# Patient Record
Sex: Female | Born: 1960 | Race: White | Hispanic: No | Smoking: Never smoker
Health system: Southern US, Community
[De-identification: ages and names within clinical notes are randomized; demographics above are authoritative.]

## PROBLEM LIST (undated history)

## (undated) DIAGNOSIS — G4700 Insomnia, unspecified: Secondary | ICD-10-CM

## (undated) DIAGNOSIS — J45909 Unspecified asthma, uncomplicated: Secondary | ICD-10-CM

## (undated) DIAGNOSIS — R638 Other symptoms and signs concerning food and fluid intake: Secondary | ICD-10-CM

## (undated) DIAGNOSIS — M5126 Other intervertebral disc displacement, lumbar region: Secondary | ICD-10-CM

## (undated) DIAGNOSIS — M79644 Pain in right finger(s): Secondary | ICD-10-CM

## (undated) DIAGNOSIS — K449 Diaphragmatic hernia without obstruction or gangrene: Secondary | ICD-10-CM

## (undated) DIAGNOSIS — Z780 Asymptomatic menopausal state: Secondary | ICD-10-CM

## (undated) DIAGNOSIS — E7800 Pure hypercholesterolemia, unspecified: Secondary | ICD-10-CM

## (undated) DIAGNOSIS — K589 Irritable bowel syndrome without diarrhea: Secondary | ICD-10-CM

## (undated) DIAGNOSIS — N3946 Mixed incontinence: Secondary | ICD-10-CM

## (undated) DIAGNOSIS — M1990 Unspecified osteoarthritis, unspecified site: Secondary | ICD-10-CM

## (undated) DIAGNOSIS — K219 Gastro-esophageal reflux disease without esophagitis: Secondary | ICD-10-CM

## (undated) DIAGNOSIS — M79645 Pain in left finger(s): Secondary | ICD-10-CM

## (undated) DIAGNOSIS — F419 Anxiety disorder, unspecified: Secondary | ICD-10-CM

## (undated) DIAGNOSIS — M5136 Other intervertebral disc degeneration, lumbar region: Secondary | ICD-10-CM

## (undated) DIAGNOSIS — F329 Major depressive disorder, single episode, unspecified: Secondary | ICD-10-CM

## (undated) DIAGNOSIS — IMO0002 Reserved for concepts with insufficient information to code with codable children: Secondary | ICD-10-CM

## (undated) DIAGNOSIS — Z87898 Personal history of other specified conditions: Secondary | ICD-10-CM

## (undated) DIAGNOSIS — F32A Depression, unspecified: Secondary | ICD-10-CM

## (undated) DIAGNOSIS — K635 Polyp of colon: Secondary | ICD-10-CM

## (undated) HISTORY — DX: Diaphragmatic hernia without obstruction or gangrene: K44.9

## (undated) HISTORY — DX: Unspecified osteoarthritis, unspecified site: M19.90

## (undated) HISTORY — PX: HYSTEROSCOPY: SHX211

## (undated) HISTORY — DX: Major depressive disorder, single episode, unspecified: F32.9

## (undated) HISTORY — PX: DIAGNOSTIC LAPAROSCOPY: SUR761

## (undated) HISTORY — DX: Anxiety disorder, unspecified: F41.9

## (undated) HISTORY — DX: Other intervertebral disc degeneration, lumbar region: M51.36

## (undated) HISTORY — DX: Depression, unspecified: F32.A

## (undated) HISTORY — PX: FOOT SURGERY: SHX648

## (undated) HISTORY — DX: Unspecified asthma, uncomplicated: J45.909

## (undated) HISTORY — PX: DILATION AND CURETTAGE OF UTERUS: SHX78

## (undated) HISTORY — DX: Asymptomatic menopausal state: Z78.0

## (undated) HISTORY — DX: Other symptoms and signs concerning food and fluid intake: R63.8

## (undated) HISTORY — DX: Pure hypercholesterolemia, unspecified: E78.00

## (undated) HISTORY — DX: Mixed incontinence: N39.46

## (undated) HISTORY — DX: Other intervertebral disc displacement, lumbar region: M51.26

## (undated) HISTORY — DX: Reserved for concepts with insufficient information to code with codable children: IMO0002

## (undated) HISTORY — DX: Polyp of colon: K63.5

## (undated) HISTORY — DX: Gastro-esophageal reflux disease without esophagitis: K21.9

---

## 2004-04-14 ENCOUNTER — Ambulatory Visit: Payer: Self-pay | Admitting: Anesthesiology

## 2004-10-12 ENCOUNTER — Ambulatory Visit: Payer: Self-pay | Admitting: Anesthesiology

## 2005-02-16 ENCOUNTER — Ambulatory Visit: Payer: Self-pay | Admitting: Anesthesiology

## 2005-03-29 ENCOUNTER — Ambulatory Visit: Payer: Self-pay | Admitting: Anesthesiology

## 2005-05-17 ENCOUNTER — Ambulatory Visit: Payer: Self-pay | Admitting: Obstetrics and Gynecology

## 2006-01-05 ENCOUNTER — Ambulatory Visit: Payer: Self-pay | Admitting: Anesthesiology

## 2006-03-06 ENCOUNTER — Ambulatory Visit: Payer: Self-pay | Admitting: Anesthesiology

## 2006-05-22 ENCOUNTER — Ambulatory Visit: Payer: Self-pay | Admitting: Obstetrics and Gynecology

## 2006-12-13 ENCOUNTER — Ambulatory Visit: Payer: Self-pay | Admitting: Anesthesiology

## 2007-12-17 ENCOUNTER — Ambulatory Visit: Payer: Self-pay | Admitting: Internal Medicine

## 2008-01-03 ENCOUNTER — Ambulatory Visit: Payer: Self-pay | Admitting: Anesthesiology

## 2008-06-06 ENCOUNTER — Ambulatory Visit: Payer: Self-pay | Admitting: Obstetrics and Gynecology

## 2012-11-20 ENCOUNTER — Ambulatory Visit: Payer: Self-pay | Admitting: Internal Medicine

## 2012-11-20 LAB — HM PAP SMEAR: HM PAP: NEGATIVE

## 2013-06-27 LAB — HM MAMMOGRAPHY

## 2014-06-23 ENCOUNTER — Ambulatory Visit: Payer: Self-pay | Admitting: Internal Medicine

## 2015-06-30 ENCOUNTER — Encounter: Payer: Self-pay | Admitting: Obstetrics and Gynecology

## 2015-10-12 ENCOUNTER — Other Ambulatory Visit: Payer: Self-pay | Admitting: Internal Medicine

## 2015-10-12 DIAGNOSIS — Z1231 Encounter for screening mammogram for malignant neoplasm of breast: Secondary | ICD-10-CM

## 2015-10-21 ENCOUNTER — Ambulatory Visit
Admission: RE | Admit: 2015-10-21 | Discharge: 2015-10-21 | Disposition: A | Discharge status: Home / Self Care | Payer: BC Managed Care – PPO | Source: Ambulatory Visit | Attending: Internal Medicine | Admitting: Internal Medicine

## 2015-10-21 DIAGNOSIS — Z1231 Encounter for screening mammogram for malignant neoplasm of breast: Secondary | ICD-10-CM | POA: Insufficient documentation

## 2015-11-27 NOTE — Discharge Instructions (Signed)
[facility] °[facility] ° °POST OPERATIVE INSTRUCTIONS FOR DR. TROXLER AND DR. FOWLER °[facility] ° ° °1. Take your medication as prescribed.  Pain medication should be taken only as needed. ° °2. Keep the dressing clean, dry and intact. ° °3. Keep your foot elevated above the heart level for the first 48 hours. ° °4. Walking to the bathroom and brief periods of walking are acceptable, unless we have instructed you to be non-weight bearing. ° °5. Always wear your post-op shoe when walking.  Always use your crutches if you are to be non-weight bearing. ° °6. Do not take a shower. Baths are permissible as long as the foot is kept out of the water.  ° °7. Every hour you are awake:  °- Bend your knee 15 times. °- Flex foot 15 times °- Massage calf 15 times ° °8. Call [facility] ([phone]) if any of the following problems occur: °- You develop a temperature or fever. °- The bandage becomes saturated with blood. °- Medication does not stop your pain. °- Injury of the foot occurs. °- Any symptoms of infection including redness, odor, or red streaks running from wound. ° °General Anesthesia, Adult, Care After °Refer to this sheet in the next few weeks. These instructions provide you with information on caring for yourself after your procedure. Your health care provider may also give you more specific instructions. Your treatment has been planned according to current medical practices, but problems sometimes occur. Call your health care provider if you have any problems or questions after your procedure. °WHAT TO EXPECT AFTER THE PROCEDURE °After the procedure, it is typical to experience: °· Sleepiness. °· Nausea and vomiting. °HOME CARE INSTRUCTIONS °· For the first 24 hours after general anesthesia: °¨ Have a responsible person with you. °¨ Do not drive a car. If you are alone, do not take public transportation. °¨ Do not drink alcohol. °¨ Do not take  medicine that has not been prescribed by your health care provider. °¨ Do not sign important papers or make important decisions. °¨ You may resume a normal diet and activities as directed by your health care provider. °· Change bandages (dressings) as directed. °· If you have questions or problems that seem related to general anesthesia, call the hospital and ask for the anesthetist or anesthesiologist on call. °SEEK MEDICAL CARE IF: °· You have nausea and vomiting that continue the day after anesthesia. °· You develop a rash. °SEEK IMMEDIATE MEDICAL CARE IF:  °· You have difficulty breathing. °· You have chest pain. °· You have any allergic problems. °  °This information is not intended to replace advice given to you by your health care provider. Make sure you discuss any questions you have with your health care provider. °  °Document Released: 09/19/2000 Document Revised: 07/04/2014 Document Reviewed: 10/12/2011 °Elsevier Interactive Patient Education ©2016 [employer] ° °

## 2015-12-03 ENCOUNTER — Ambulatory Visit: Payer: BC Managed Care – PPO

## 2015-12-03 ENCOUNTER — Ambulatory Visit: Payer: BC Managed Care – PPO | Admitting: Anesthesiology

## 2015-12-03 ENCOUNTER — Encounter
Admission: RE | Disposition: A | Discharge status: Home / Self Care | Payer: Self-pay | Source: Ambulatory Visit | Attending: Podiatry

## 2015-12-03 ENCOUNTER — Ambulatory Visit
Admission: RE | Admit: 2015-12-03 | Discharge: 2015-12-03 | Disposition: A | Discharge status: Home / Self Care | Payer: BC Managed Care – PPO | Source: Ambulatory Visit | Attending: Podiatry | Admitting: Podiatry

## 2015-12-03 DIAGNOSIS — G8929 Other chronic pain: Secondary | ICD-10-CM | POA: Insufficient documentation

## 2015-12-03 DIAGNOSIS — F418 Other specified anxiety disorders: Secondary | ICD-10-CM | POA: Insufficient documentation

## 2015-12-03 DIAGNOSIS — J45909 Unspecified asthma, uncomplicated: Secondary | ICD-10-CM | POA: Insufficient documentation

## 2015-12-03 DIAGNOSIS — Q660 Congenital talipes equinovarus: Secondary | ICD-10-CM | POA: Diagnosis not present

## 2015-12-03 DIAGNOSIS — M2041 Other hammer toe(s) (acquired), right foot: Secondary | ICD-10-CM | POA: Insufficient documentation

## 2015-12-03 DIAGNOSIS — M21171 Varus deformity, not elsewhere classified, right ankle: Secondary | ICD-10-CM | POA: Diagnosis not present

## 2015-12-03 DIAGNOSIS — K219 Gastro-esophageal reflux disease without esophagitis: Secondary | ICD-10-CM | POA: Insufficient documentation

## 2015-12-03 DIAGNOSIS — S90851A Superficial foreign body, right foot, initial encounter: Secondary | ICD-10-CM

## 2015-12-03 DIAGNOSIS — M1990 Unspecified osteoarthritis, unspecified site: Secondary | ICD-10-CM | POA: Diagnosis not present

## 2015-12-03 DIAGNOSIS — L089 Local infection of the skin and subcutaneous tissue, unspecified: Secondary | ICD-10-CM

## 2015-12-03 HISTORY — PX: GASTROC RECESSION EXTREMITY: SHX6262

## 2015-12-03 HISTORY — DX: Insomnia, unspecified: G47.00

## 2015-12-03 HISTORY — DX: Pain in right finger(s): M79.644

## 2015-12-03 HISTORY — DX: Irritable bowel syndrome without diarrhea: K58.9

## 2015-12-03 HISTORY — DX: Personal history of other specified conditions: Z87.898

## 2015-12-03 HISTORY — DX: Unspecified asthma, uncomplicated: J45.909

## 2015-12-03 HISTORY — DX: Pain in left finger(s): M79.645

## 2015-12-03 SURGERY — RECESSION, TENDON, GASTROCNEMIUS
Anesthesia: General | Site: Foot | Laterality: Right | Wound class: Clean

## 2015-12-03 MED ORDER — OXYCODONE-ACETAMINOPHEN 7.5-325 MG PO TABS: 1.0000 | ORAL_TABLET | ORAL | Status: DC | PRN

## 2015-12-03 MED ORDER — ROPIVACAINE HCL 5 MG/ML IJ SOLN
INTRAMUSCULAR | Status: DC | PRN
  Administered 2015-12-03: 50 mL via EPIDURAL

## 2015-12-03 MED ORDER — BUPIVACAINE HCL (PF) 0.5 % IJ SOLN
INTRAMUSCULAR | Status: DC | PRN
  Administered 2015-12-03: 7 mL

## 2015-12-03 MED ORDER — LACTATED RINGERS IV SOLN
INTRAVENOUS | Status: DC
  Administered 2015-12-03: 07:00:00 via INTRAVENOUS

## 2015-12-03 MED ORDER — OXYCODONE HCL 5 MG PO TABS: 5.0000 mg | ORAL_TABLET | Freq: Once | ORAL | Status: DC | PRN

## 2015-12-03 MED ORDER — MIDAZOLAM HCL 2 MG/2ML IJ SOLN
1.0000 mg | INTRAMUSCULAR | Status: DC | PRN
  Administered 2015-12-03: 2 mg via INTRAVENOUS

## 2015-12-03 MED ORDER — SCOPOLAMINE 1 MG/3DAYS TD PT72
MEDICATED_PATCH | TRANSDERMAL | Status: DC | PRN
  Administered 2015-12-03: 1 via TRANSDERMAL

## 2015-12-03 MED ORDER — LIDOCAINE-EPINEPHRINE 1 %-1:100000 IJ SOLN
INTRAMUSCULAR | Status: DC | PRN
  Administered 2015-12-03: 10 mL

## 2015-12-03 MED ORDER — PROPOFOL 500 MG/50ML IV EMUL
INTRAVENOUS | Status: DC | PRN
Start: 2015-12-03 — End: 2015-12-03
  Administered 2015-12-03: 75 ug/kg/min via INTRAVENOUS

## 2015-12-03 MED ORDER — ONDANSETRON HCL 4 MG/2ML IJ SOLN
INTRAMUSCULAR | Status: DC | PRN
  Administered 2015-12-03: 4 mg via INTRAVENOUS

## 2015-12-03 MED ORDER — DEXAMETHASONE SODIUM PHOSPHATE 4 MG/ML IJ SOLN
INTRAMUSCULAR | Status: DC | PRN
  Administered 2015-12-03: 8 mg via INTRAVENOUS

## 2015-12-03 MED ORDER — LIDOCAINE HCL (CARDIAC) 20 MG/ML IV SOLN
INTRAVENOUS | Status: DC | PRN
  Administered 2015-12-03: 30 mg via INTRAVENOUS
  Administered 2015-12-03: 20 mg via INTRAVENOUS

## 2015-12-03 MED ORDER — FENTANYL CITRATE (PF) 100 MCG/2ML IJ SOLN: 25.0000 ug | INTRAMUSCULAR | Status: DC | PRN

## 2015-12-03 MED ORDER — OXYCODONE HCL 5 MG/5ML PO SOLN: 5.0000 mg | Freq: Once | ORAL | Status: DC | PRN

## 2015-12-03 MED ORDER — FENTANYL CITRATE (PF) 100 MCG/2ML IJ SOLN
50.0000 ug | INTRAMUSCULAR | Status: DC | PRN
  Administered 2015-12-03: 50 ug via INTRAVENOUS

## 2015-12-03 MED ORDER — SODIUM CHLORIDE 0.9 % IV SOLN
600.0000 mg | Freq: Once | INTRAVENOUS | Status: AC
  Administered 2015-12-03: 600 mg via INTRAVENOUS

## 2015-12-03 MED ORDER — PROPOFOL 10 MG/ML IV BOLUS
INTRAVENOUS | Status: DC | PRN
  Administered 2015-12-03: 100 mg via INTRAVENOUS

## 2015-12-03 SURGICAL SUPPLY — 78 items
BAG SNAP BAND KOVER 36X36 (MISCELLANEOUS) ×12 IMPLANT
BANDAGE ELASTIC 4 CLIP NS LF (GAUZE/BANDAGES/DRESSINGS) ×4 IMPLANT
BENZOIN TINCTURE PRP APPL 2/3 (GAUZE/BANDAGES/DRESSINGS) ×8 IMPLANT
BIT DRILL CANN 3.0 (BIT) ×4 IMPLANT
BLADE CRESCENTIC (BLADE) IMPLANT
BLADE MED AGGRESSIVE (BLADE) ×4 IMPLANT
BLADE MINI RND TIP GREEN BEAV (BLADE) ×4 IMPLANT
BLADE OSC/SAGITTAL 5.5X25 (BLADE) IMPLANT
BLADE OSC/SAGITTAL MD 5.5X18 (BLADE) IMPLANT
BLADE OSC/SAGITTAL MD 9X18.5 (BLADE) IMPLANT
BLADE OSCILLATING/SAGITTAL (BLADE) ×2
BLADE SW THK.38XMED LNG THN (BLADE) ×2 IMPLANT
BNDG ESMARK 4X12 TAN STRL LF (GAUZE/BANDAGES/DRESSINGS) ×4 IMPLANT
BNDG GAUZE 4.5X4.1 6PLY STRL (MISCELLANEOUS) ×4 IMPLANT
BNDG STRETCH 4X75 STRL LF (GAUZE/BANDAGES/DRESSINGS) ×4 IMPLANT
BUR EGG 4X8 MED (BURR) IMPLANT
BUR STRYKR EGG 5.0 (BURR) IMPLANT
CANISTER SUCT 1200ML W/VALVE (MISCELLANEOUS) ×4 IMPLANT
CAST PADDING 3X4FT ST 30246 (SOFTGOODS) ×2
CLOSURE WOUND 1/4X4 (GAUZE/BANDAGES/DRESSINGS) ×1
COVER LIGHT HANDLE UNIVERSAL (MISCELLANEOUS) ×12 IMPLANT
COVER PIN YLW 0.028-062 (MISCELLANEOUS) ×4 IMPLANT
CUFF TOURN SGL QUICK 18 (TOURNIQUET CUFF) IMPLANT
CUFF TOURN SGL QUICK 34 (TOURNIQUET CUFF) ×2
CUFF TRNQT CYL 34X4X40X1 (TOURNIQUET CUFF) ×2 IMPLANT
DRAPE FLUOR MINI C-ARM 54X84 (DRAPES) ×4 IMPLANT
DRAPE SHEET LG 3/4 BI-LAMINATE (DRAPES) ×12 IMPLANT
DRILL WIRE PASS (DRILL) IMPLANT
DRSG TELFA 3X8 NADH (GAUZE/BANDAGES/DRESSINGS) ×4 IMPLANT
DURAPREP 26ML APPLICATOR (WOUND CARE) ×8 IMPLANT
GAUZE PETRO XEROFOAM 1X8 (MISCELLANEOUS) ×4 IMPLANT
GAUZE SPONGE 4X4 12PLY STRL (GAUZE/BANDAGES/DRESSINGS) ×4 IMPLANT
GLOVE BIO SURGEON STRL SZ8 (GLOVE) ×4 IMPLANT
GOWN STRL REUS W/ TWL LRG LVL3 (GOWN DISPOSABLE) ×2 IMPLANT
GOWN STRL REUS W/ TWL XL LVL3 (GOWN DISPOSABLE) ×4 IMPLANT
GOWN STRL REUS W/TWL LRG LVL3 (GOWN DISPOSABLE) ×2
GOWN STRL REUS W/TWL XL LVL3 (GOWN DISPOSABLE) ×4
HEMOSTAT SURGICEL 2X3 (HEMOSTASIS) ×4 IMPLANT
K-WIRE 1.1 (WIRE) ×6
K-WIRE DBL END TROCAR 6X.045 (WIRE)
K-WIRE DBL END TROCAR 6X.062 (WIRE)
K-WIRE DBL TROCAR .045X4 ×4 IMPLANT
K-WIRE FX100X1.1XTROC TIP (WIRE) ×6
KIT ROOM TURNOVER OR (KITS) ×4 IMPLANT
KWIRE DBL END TROCAR 6X.045 (WIRE) IMPLANT
KWIRE DBL END TROCAR 6X.062 (WIRE) IMPLANT
KWIRE DBL TROCAR .045X4 ×2 IMPLANT
KWIRE FX100X1.1XTROC TIP (WIRE) ×6 IMPLANT
NEEDLE HYPO 18GX1.5 BLUNT FILL (NEEDLE) IMPLANT
NEEDLE HYPO 25GX1X1/2 BEV (NEEDLE) IMPLANT
NS IRRIG 500ML POUR BTL (IV SOLUTION) ×4 IMPLANT
PACK EXTREMITY ARMC (MISCELLANEOUS) ×4 IMPLANT
PAD CAST CTTN 3X4 STRL (SOFTGOODS) ×2 IMPLANT
PAD GROUND ADULT SPLIT (MISCELLANEOUS) ×8 IMPLANT
RASP SM TEAR CROSS CUT (RASP) IMPLANT
SCREW CANN 2.2X12 (Screw) ×8 IMPLANT
SCREW COMP SH THR 2.2X11 (Screw) IMPLANT
SCREW COMP ST 14MM (Screw) ×4 IMPLANT
SPLINT CAST 1 STEP 4X30 (MISCELLANEOUS) ×4 IMPLANT
SPLINT FAST PLASTER 5X30 (CAST SUPPLIES)
SPLINT PLASTER CAST FAST 5X30 (CAST SUPPLIES) IMPLANT
STOCKINETTE STRL 6IN 960660 (GAUZE/BANDAGES/DRESSINGS) ×8 IMPLANT
STRAP BODY AND KNEE 60X3 (MISCELLANEOUS) ×8 IMPLANT
STRIP CLOSURE SKIN 1/4X4 (GAUZE/BANDAGES/DRESSINGS) ×3 IMPLANT
SUT ETHILON 4-0 (SUTURE)
SUT ETHILON 4-0 FS2 18XMFL BLK (SUTURE)
SUT ETHILON 5-0 FS-2 18 BLK (SUTURE) ×8 IMPLANT
SUT VIC AB 1 CT1 36 (SUTURE) IMPLANT
SUT VIC AB 2-0 CT1 27 (SUTURE)
SUT VIC AB 2-0 CT1 TAPERPNT 27 (SUTURE) IMPLANT
SUT VIC AB 2-0 SH 27 (SUTURE)
SUT VIC AB 2-0 SH 27XBRD (SUTURE) IMPLANT
SUT VIC AB 3-0 SH 27 (SUTURE)
SUT VIC AB 3-0 SH 27X BRD (SUTURE) IMPLANT
SUT VIC AB 4-0 FS2 27 (SUTURE) ×4 IMPLANT
SUT VICRYL AB 3-0 FS1 BRD 27IN (SUTURE) IMPLANT
SUTURE ETHLN 4-0 FS2 18XMF BLK (SUTURE) IMPLANT
SYRINGE 10CC LL (SYRINGE) IMPLANT

## 2015-12-03 NOTE — Anesthesia Preprocedure Evaluation (Signed)
Anesthesia Evaluation  Patient identified by MRN, date of birth, ID band Patient awake    Airway Mallampati: III  TM Distance: >3 FB Neck ROM: Full    Dental   Pulmonary asthma (well controlled) ,    breath sounds clear to auscultation (-) wheezing      Cardiovascular Normal cardiovascular exam     Neuro/Psych Anxiety Depression    GI/Hepatic GERD  Medicated and Controlled,  Endo/Other    Renal/GU      Musculoskeletal  (+) Arthritis , Osteoarthritis,    Abdominal   Peds  Hematology   Anesthesia Other Findings   Reproductive/Obstetrics                             Anesthesia Physical Anesthesia Plan  ASA: II  Anesthesia Plan:    Post-op Pain Management: GA combined w/ Regional for post-op pain   Induction: Intravenous  Airway Management Planned: LMA  Additional Equipment:   Intra-op Plan:   Post-operative Plan:   Informed Consent: I have reviewed the patients History and Physical, chart, labs and discussed the procedure including the risks, benefits and alternatives for the proposed anesthesia with the patient or authorized representative who has indicated his/her understanding and acceptance.     Plan Discussed with: CRNA  Anesthesia Plan Comments:         Anesthesia Quick Evaluation

## 2015-12-03 NOTE — Op Note (Signed)
Operative note   Surgeon: Dr. Recardo EvangelistMatthew Zakiya Novak, DPM.    Assistant: None    Preop diagnosis: #1 gastroc equinus right. #2 plantar displaced second metatarsal right. #3 plantar displaced third metatarsal right. #4 hammertoe deformity second toe right    Postop diagnosis: Same    Procedure:   1. Gastroc recession right Achilles   2. Osteotomy second metatarsal with 2 screw fixation   3. Osteotomy third metatarsal with 2 screw fixation    4. Hammertoe repair with arthrodesis of the PIP joint and K wire fixation     EBL: 20 cc    Anesthesia:general with popliteal block delivered by anesthesia team    Hemostasis: Thigh tourniquet 325 mmHg pressure initially up for 19 minutes then released for 20 minutes then re-elevated again for 51 minutes.    Specimen: None    Complications: None     Operative indications. Chronic pain and structural deformity unresponsive to conservative care    Procedure:  Patient was brought into the OR and placed on the operating table in theprone position initially after the gastroc recession the patient was able to help turnover and was placed in the supine position for the remainder of the case.. After anesthesia was obtained theright lower extremity was prepped and draped in usual sterile fashion.  Operative Report: At this time attention was directed to the right gastrocnemius tendon area just below the muscular good tendinous junction to the gastroc. A 3 semilunar incision was made just medial to the midline of the calf. The incision was deepened sharp blunt dissection bleeders clamped and bovied as required. Blunt dissection was used and vital structures retracted mediolaterally. The tendon sheath was noted this time and incised longitudinally peritenon was incised and retracted also mediolaterally. Dorsiflexion was placed to the foot tightening the gastrocnemius tendon and the gastroc tendon was released from medial to lateral. Excellent range of motion was  noted at this was Was. Following copious irrigation the tendon sheath was closed as well as the peritenon with 4 Vicryl continuous stitch. Deep superficial fascial layers closed with 4 Vicryl in continuous stitch and skin closed with 4 Vicryl subcuticular fashion.  This time to his directed the second metatarsal phalangeal joint and toe where a curvilinear incision made over the MTPJ and extended into 2 semielliptical incisions over the second toe. The slip skin was then removed the entire incision was then deepened sharp blunt dissection. The extensor tendon over the MTP joint distal metatarsal head was identified and released medially and retracted laterally and incision made through the capsular periosteal tissue and deepened with sharp dissection fraying the medial lateral capsular tissue away from the metatarsal head. An osteotomy was then performed in a dorsal distal plantar proximal fashion obliquely starting at the osteochondral junction of second metatarsal head dorsally. Once this was through and through the metatarsal head was shortened the osteotomy was feathered so that there would be no plantar flexion with the shortening process. The area was then temporally fixated checked FluoroScan good position and correction were noted. This point one of the K wires broke off during the drilling process this was removed checked with FluoroScan and no residual pin was noted. As per protocol heart x-ray was taken and sent to radiology for an over read they also didn't see any pins remaining in the foot. Once this was assured and was not in their through FluoroScan and the procedure was continued. 2 screws were placed across the osteotomy air checked FluoroScan good position and correction  were noted. This time to his directed to the PIP joint of the second toe where the extensor tendon was identified and incised transversely reflected proximally. The articular cartilages moved from the proximal phalanx head and  middle phalanx base and 0.5 K wires drilled through the middle distal phalanx and retrograded into the proximal phalanx holding the middle phalanx base proximal phalanx head together care was taken to remove any soft tissue impingement. There is checked with visual and FluoroScan and good alignment to the PIP joint was noted the pin was in carried through the metatarsal phalangeal joint across the metatarsal head while holding the toe in a plantar and lateral position to promote correction of the position of the joint. There is checked FluoroScan good position and correction were noted. There is an copiously irrigated and capsule tissue was closed with 4 Vicryl in a continuous stitch as were deep superficial fascial layers. Skin overlying the metatarsal phalangeal joint was closed with 4 Vicryl subcuticular stitch. Extensor tendon was reapproximated with 4 Vicryl simple interrupted sutures. Skin over the toe was closed with 5-0 nylon horizontal mattress sutures.  This time to his directed to the third metatarsal of the right foot where similar procedures performed as that on the second metatarsal. No hammertoe was needed.  Sterile compressive dressings placed across the area following a Marcaine block at the base of the incision margins on the dorsum of the foot. Dressings included Steri-Strips Xeroform gauze 4 x 4's Kling Kerlix tourniquet was released proximal complete vascularity seen to return all digits of the right foot. A posterior splint is placed on the right foot leg in the operating room.    Patient tolerated the procedure and anesthesia well.  Was transported from the OR to the PACU with all vital signs stable and vascular status intact. To be discharged per routine protocol.  Will follow up in approximately 1 week in the outpatient clinic.

## 2015-12-03 NOTE — Progress Notes (Signed)
Assisted Scott Mculloch ANMD with right, ultrasound guided, popliteal/saphenous block. Side rails up, monitors on throughout procedure. See vital signs in flow sheet. Tolerated Procedure well.

## 2015-12-03 NOTE — Transfer of Care (Signed)
Immediate Anesthesia Transfer of Care Note  Patient: Ashley Novak  Procedure(s) Performed: Procedure(s) with comments: WEIL OSTEOTOMY 2ND 3RD METATARSAL RIGHT (Right) - LMA WITH POPLITEAL K WIRE HAMMER TOE CORRECTION (Right) GASTROC RECESSION EXTREMITY (Right)  Patient Location: PACU  Anesthesia Type: No value filed.  Level of Consciousness: awake, alert  and patient cooperative  Airway and Oxygen Therapy: Patient Spontanous Breathing and Patient connected to supplemental oxygen  Post-op Assessment: Post-op Vital signs reviewed, Patient's Cardiovascular Status Stable, Respiratory Function Stable, Patent Airway and No signs of Nausea or vomiting  Post-op Vital Signs: Reviewed and stable  Complications: No apparent anesthesia complications

## 2015-12-03 NOTE — Anesthesia Postprocedure Evaluation (Signed)
Anesthesia Post Note  Patient: Ashley Novak  Procedure(s) Performed: Procedure(s) (LRB): WEIL OSTEOTOMY 2ND 3RD METATARSAL RIGHT (Right) HAMMER TOE CORRECTION (Right) GASTROC RECESSION EXTREMITY (Right)  Patient location during evaluation: PACU Anesthesia Type: General Level of consciousness: awake and alert Pain management: pain level controlled Vital Signs Assessment: post-procedure vital signs reviewed and stable Respiratory status: spontaneous breathing, nonlabored ventilation, respiratory function stable and patient connected to nasal cannula oxygen Cardiovascular status: blood pressure returned to baseline and stable Postop Assessment: no signs of nausea or vomiting Anesthetic complications: no    Ashley Novak, Ashley Novak

## 2015-12-03 NOTE — H&P (Signed)
H and P has been reviewed and no changes are noted.  

## 2015-12-03 NOTE — Anesthesia Procedure Notes (Addendum)
Anesthesia Regional Block:  Adductor canal block  Pre-Anesthetic Checklist: ,, timeout performed, Correct Patient, Correct Site, Correct Laterality, Correct Procedure, Correct Position, site marked, Risks and benefits discussed,  Surgical consent,  Pre-op evaluation,  At surgeon's request and post-op pain management   Prep: chloraprep       Needles:  Injection technique: Single-shot  Needle Type: Echogenic Needle     Needle Length: 9cm 9 cm Needle Gauge: 21 and 21 G    Additional Needles:  Procedures: ultrasound guided (picture in chart) Adductor canal block Narrative:  Start time: 12/03/2015 7:01 AM End time: 12/03/2015 7:13 AM Injection made incrementally with aspirations every 5 mL.  Performed by: Personally  Anesthesiologist: MCCULLOCH, SCOTT  Additional Notes: Functioning IV was confirmed and monitors applied. Ultrasound guidance: relevant anatomy identified, needle position confirmed, local anesthetic spread visualized around nerve(s)., vascular puncture avoided.  Image printed for medical record.  Negative aspiration and no paresthesias; incremental administration of local anesthetic. The patient tolerated the procedure well. Vitals signes recorded in RN notes.   Anesthesia Regional Block:  Popliteal block  Pre-Anesthetic Checklist: ,, timeout performed, Correct Patient, Correct Site, Correct Laterality, Correct Procedure, Correct Position, site marked, Risks and benefits discussed,  Surgical consent,  Pre-op evaluation,  At surgeon's request and post-op pain management  Laterality: Right  Prep: chloraprep       Needles:  Injection technique: Single-shot  Needle Type: Echogenic Needle     Needle Length: 9cm 9 cm Needle Gauge: 21 and 21 G    Additional Needles:  Procedures: ultrasound guided (picture in chart) Popliteal block Narrative:  Start time: 12/03/2015 7:01 AM End time: 12/03/2015 7:13 AM Injection made incrementally with aspirations every 5  mL.  Performed by: Personally  Anesthesiologist: MCCULLOCH, SCOTT  Additional Notes: Functioning IV was confirmed and monitors applied. Ultrasound guidance: relevant anatomy identified, needle position confirmed, local anesthetic spread visualized around nerve(s)., vascular puncture avoided.  Image printed for medical record.  Negative aspiration and no paresthesias; incremental administration of local anesthetic. The patient tolerated the procedure well. Vitals signes recorded in RN notes.   Procedure Name: MAC Date/Time: 12/03/2015 7:40 AM Performed by: Maree KrabbeWARREN, Yadiel Aubry Pre-anesthesia Checklist: Patient identified, Emergency Drugs available, Suction available, Patient being monitored and Timeout performed Patient Re-evaluated:Patient Re-evaluated prior to inductionOxygen Delivery Method: Simple face mask Placement Confirmation: positive ETCO2 and breath sounds checked- equal and bilateral    Procedure Name: LMA Insertion Date/Time: 12/03/2015 8:25 AM Performed by: Maree KrabbeWARREN, Pio Eatherly Pre-anesthesia Checklist: Patient identified, Emergency Drugs available, Suction available, Timeout performed and Patient being monitored Patient Re-evaluated:Patient Re-evaluated prior to inductionOxygen Delivery Method: Circle system utilized Preoxygenation: Pre-oxygenation with 100% oxygen Intubation Type: IV induction LMA: LMA inserted LMA Size: 4.0 Number of attempts: 1 Placement Confirmation: positive ETCO2 and breath sounds checked- equal and bilateral Tube secured with: Tape

## 2015-12-03 NOTE — OR Nursing (Signed)
X-RAY TAKEN BY RADIOLOGY DEPARTMENT PER ORDER OF DR. TROXLER.PER DR. TROXLER IT TOOK 45 MINUTES FOR RADIOLOGIST TO CALL WITH HIS REPORT

## 2015-12-04 ENCOUNTER — Encounter: Payer: Self-pay | Admitting: Podiatry

## 2016-06-10 NOTE — Discharge Instructions (Signed)
[facility] °[facility] ° °POST OPERATIVE INSTRUCTIONS FOR DR. TROXLER AND DR. FOWLER °[facility] ° ° °1. Take your medication as prescribed.  Pain medication should be taken only as needed. ° °2. Keep the dressing clean, dry and intact. ° °3. Keep your foot elevated above the heart level for the first 48 hours. ° °4. Walking to the bathroom and brief periods of walking are acceptable, unless we have instructed you to be non-weight bearing. ° °5. Always wear your post-op shoe when walking.  Always use your crutches if you are to be non-weight bearing. ° °6. Do not take a shower. Baths are permissible as long as the foot is kept out of the water.  ° °7. Every hour you are awake:  °- Bend your knee 15 times. °- Flex foot 15 times °- Massage calf 15 times ° °8. Call [facility] ([phone]) if any of the following problems occur: °- You develop a temperature or fever. °- The bandage becomes saturated with blood. °- Medication does not stop your pain. °- Injury of the foot occurs. °- Any symptoms of infection including redness, odor, or red streaks running from wound. ° ° °General Anesthesia, Adult, Care After °These instructions provide you with information about caring for yourself after your procedure. Your health care provider may also give you more specific instructions. Your treatment has been planned according to current medical practices, but problems sometimes occur. Call your health care provider if you have any problems or questions after your procedure. °What can I expect after the procedure? °After the procedure, it is common to have: °· Vomiting. °· A sore throat. °· Mental slowness. °It is common to feel: °· Nauseous. °· Cold or shivery. °· Sleepy. °· Tired. °· Sore or achy, even in parts of your body where you did not have surgery. °Follow these instructions at home: °For at least 24 hours after the procedure: °· Do not: °¨ Participate in  activities where you could fall or become injured. °¨ Drive. °¨ Use heavy machinery. °¨ Drink alcohol. °¨ Take sleeping pills or medicines that cause drowsiness. °¨ Make important decisions or sign legal documents. °¨ Take care of children on your own. °· Rest. °Eating and drinking °· If you vomit, drink water, juice, or soup when you can drink without vomiting. °· Drink enough fluid to keep your urine clear or pale yellow. °· Make sure you have little or no nausea before eating solid foods. °· Follow the diet recommended by your health care provider. °General instructions °· Have a responsible adult stay with you until you are awake and alert. °· Return to your normal activities as told by your health care provider. Ask your health care provider what activities are safe for you. °· Take over-the-counter and prescription medicines only as told by your health care provider. °· If you smoke, do not smoke without supervision. °· Keep all follow-up visits as told by your health care provider. This is important. °Contact a health care provider if: °· You continue to have nausea or vomiting at home, and medicines are not helpful. °· You cannot drink fluids or start eating again. °· You cannot urinate after 8-12 hours. °· You develop a skin rash. °· You have fever. °· You have increasing redness at the site of your procedure. °Get help right away if: °· You have difficulty breathing. °· You have chest pain. °· You have unexpected bleeding. °· You feel that you are having   a life-threatening or urgent problem. °This information is not intended to replace advice given to you by your health care provider. Make sure you discuss any questions you have with your health care provider. °Document Released: 09/19/2000 Document Revised: 11/16/2015 Document Reviewed: 05/28/2015 °Elsevier Interactive Patient Education © 2017 [employer] Inc. ° °

## 2016-06-13 ENCOUNTER — Encounter: Payer: Self-pay | Admitting: *Deleted

## 2016-06-16 ENCOUNTER — Encounter
Admission: RE | Disposition: A | Discharge status: Home / Self Care | Payer: Self-pay | Source: Ambulatory Visit | Attending: Podiatry

## 2016-06-16 ENCOUNTER — Ambulatory Visit: Payer: BC Managed Care – PPO | Admitting: Anesthesiology

## 2016-06-16 ENCOUNTER — Ambulatory Visit
Admission: RE | Admit: 2016-06-16 | Discharge: 2016-06-16 | Disposition: A | Discharge status: Home / Self Care | Payer: BC Managed Care – PPO | Source: Ambulatory Visit | Attending: Podiatry | Admitting: Podiatry

## 2016-06-16 DIAGNOSIS — G8929 Other chronic pain: Secondary | ICD-10-CM | POA: Insufficient documentation

## 2016-06-16 DIAGNOSIS — E7800 Pure hypercholesterolemia, unspecified: Secondary | ICD-10-CM | POA: Diagnosis not present

## 2016-06-16 DIAGNOSIS — M216X2 Other acquired deformities of left foot: Secondary | ICD-10-CM | POA: Diagnosis not present

## 2016-06-16 DIAGNOSIS — J45909 Unspecified asthma, uncomplicated: Secondary | ICD-10-CM | POA: Insufficient documentation

## 2016-06-16 DIAGNOSIS — M21962 Unspecified acquired deformity of left lower leg: Secondary | ICD-10-CM

## 2016-06-16 HISTORY — PX: GASTROC RECESSION EXTREMITY: SHX6262

## 2016-06-16 HISTORY — PX: WEIL OSTEOTOMY: SHX5044

## 2016-06-16 SURGERY — RECESSION, TENDON, GASTROCNEMIUS
Anesthesia: Regional | Site: Leg Lower | Laterality: Left | Wound class: Clean

## 2016-06-16 MED ORDER — PROPOFOL 10 MG/ML IV BOLUS
INTRAVENOUS | Status: DC | PRN
  Administered 2016-06-16: 200 mg via INTRAVENOUS

## 2016-06-16 MED ORDER — BUPIVACAINE HCL (PF) 0.5 % IJ SOLN
INTRAMUSCULAR | Status: DC | PRN
  Administered 2016-06-16: 6 mL

## 2016-06-16 MED ORDER — GLYCOPYRROLATE 0.2 MG/ML IJ SOLN
INTRAMUSCULAR | Status: DC | PRN
  Administered 2016-06-16: 0.2 mg via INTRAVENOUS

## 2016-06-16 MED ORDER — LIDOCAINE HCL (CARDIAC) 20 MG/ML IV SOLN
INTRAVENOUS | Status: DC | PRN
Start: 2016-06-16 — End: 2016-06-16
  Administered 2016-06-16: 30 mg via INTRATRACHEAL

## 2016-06-16 MED ORDER — ACETAMINOPHEN 10 MG/ML IV SOLN
INTRAVENOUS | Status: DC | PRN
  Administered 2016-06-16: 1000 mg via INTRAVENOUS

## 2016-06-16 MED ORDER — OXYCODONE HCL 5 MG/5ML PO SOLN: 5.0000 mg | Freq: Once | ORAL | Status: DC | PRN

## 2016-06-16 MED ORDER — FENTANYL CITRATE (PF) 100 MCG/2ML IJ SOLN
50.0000 ug | INTRAMUSCULAR | Status: AC | PRN
  Administered 2016-06-16 (×4): 25 ug via INTRAVENOUS

## 2016-06-16 MED ORDER — LACTATED RINGERS IV SOLN
INTRAVENOUS | Status: DC
  Administered 2016-06-16 (×2): via INTRAVENOUS

## 2016-06-16 MED ORDER — CLINDAMYCIN PHOSPHATE 600 MG/4ML IJ SOLN
600.0000 mg | Freq: Once | INTRAMUSCULAR | Status: AC
  Administered 2016-06-16: 600 mg via INTRAVENOUS

## 2016-06-16 MED ORDER — LIDOCAINE-EPINEPHRINE 2 %-1:100000 IJ SOLN
INTRAMUSCULAR | Status: DC | PRN
  Administered 2016-06-16: 10 mL

## 2016-06-16 MED ORDER — OXYCODONE HCL 5 MG PO TABS: 5.0000 mg | ORAL_TABLET | Freq: Once | ORAL | Status: DC | PRN

## 2016-06-16 MED ORDER — LACTATED RINGERS IV SOLN: INTRAVENOUS | Status: DC

## 2016-06-16 MED ORDER — DEXAMETHASONE SODIUM PHOSPHATE 4 MG/ML IJ SOLN
INTRAMUSCULAR | Status: DC | PRN
  Administered 2016-06-16: 4 mg via INTRAVENOUS

## 2016-06-16 MED ORDER — OXYCODONE-ACETAMINOPHEN 7.5-325 MG PO TABS: 1.0000 | ORAL_TABLET | ORAL | 0 refills | Status: AC | PRN

## 2016-06-16 MED ORDER — ONDANSETRON HCL 4 MG/2ML IJ SOLN
INTRAMUSCULAR | Status: DC | PRN
Start: 2016-06-16 — End: 2016-06-16
  Administered 2016-06-16: 4 mg via INTRAVENOUS

## 2016-06-16 SURGICAL SUPPLY — 80 items
BANDAGE ELASTIC 4 LF NS (GAUZE/BANDAGES/DRESSINGS) ×5 IMPLANT
BENZOIN TINCTURE PRP APPL 2/3 (GAUZE/BANDAGES/DRESSINGS) ×10 IMPLANT
BIT DRILL 1.7 LNG CANN (DRILL) ×4 IMPLANT
BLADE CRESCENTIC (BLADE) IMPLANT
BLADE MED AGGRESSIVE (BLADE) IMPLANT
BLADE MINI RND TIP GREEN BEAV (BLADE) IMPLANT
BLADE OSC/SAGITTAL 5.5X25 (BLADE) IMPLANT
BLADE OSC/SAGITTAL MD 5.5X18 (BLADE) IMPLANT
BLADE OSC/SAGITTAL MD 9X18.5 (BLADE) IMPLANT
BLADE OSCILLATING/SAGITTAL (BLADE) ×2
BLADE SURG 15 STRL LF DISP TIS (BLADE) ×3 IMPLANT
BLADE SURG 15 STRL SS (BLADE) ×2
BLADE SW THK.38XMED LNG THN (BLADE) ×3 IMPLANT
BNDG COHESIVE 4X5 TAN STRL (GAUZE/BANDAGES/DRESSINGS) ×5 IMPLANT
BNDG ESMARK 4X12 TAN STRL LF (GAUZE/BANDAGES/DRESSINGS) ×5 IMPLANT
BNDG GAUZE 4.5X4.1 6PLY STRL (MISCELLANEOUS) ×5 IMPLANT
BNDG STRETCH 4X75 STRL LF (GAUZE/BANDAGES/DRESSINGS) ×5 IMPLANT
BUR EGG 4X8 MED (BURR) IMPLANT
BUR STRYKR EGG 5.0 (BURR) IMPLANT
CANISTER SUCT 1200ML W/VALVE (MISCELLANEOUS) ×5 IMPLANT
CAST PADDING 3X4FT ST 30246 (SOFTGOODS)
CLOSURE WOUND 1/4X4 (GAUZE/BANDAGES/DRESSINGS) ×1
CNTRSNK DRL 2 SCR (MISCELLANEOUS) ×3 IMPLANT
COUNTERSINK 2.0 (MISCELLANEOUS) ×2
COVER LIGHT HANDLE UNIVERSAL (MISCELLANEOUS) ×10 IMPLANT
COVER MAYO STAND STRL (DRAPES) ×5 IMPLANT
COVER PIN YLW 0.028-062 (MISCELLANEOUS) IMPLANT
CUFF TOURN SGL QUICK 18 (TOURNIQUET CUFF) IMPLANT
CUFF TOURN SGL QUICK 30 (MISCELLANEOUS) ×2
CUFF TRNQT CYL LO 30X4X (MISCELLANEOUS) ×3 IMPLANT
DRAPE FLUOR MINI C-ARM 54X84 (DRAPES) ×5 IMPLANT
DRAPE IMP U-DRAPE 54X76 (DRAPES) ×5 IMPLANT
DRAPE U 60X70 (DRAPES) ×5 IMPLANT
DRILL WIRE PASS (DRILL) IMPLANT
DRSG TEGADERM 4X4.75 (GAUZE/BANDAGES/DRESSINGS) ×5 IMPLANT
DURAPREP 26ML APPLICATOR (WOUND CARE) ×10 IMPLANT
GAUZE PETRO XEROFOAM 1X8 (MISCELLANEOUS) ×5 IMPLANT
GAUZE SPONGE 4X4 12PLY STRL (GAUZE/BANDAGES/DRESSINGS) ×5 IMPLANT
GLOVE BIO SURGEON STRL SZ8 (GLOVE) ×5 IMPLANT
GOWN STRL REUS W/ TWL LRG LVL3 (GOWN DISPOSABLE) ×3 IMPLANT
GOWN STRL REUS W/ TWL XL LVL3 (GOWN DISPOSABLE) ×6 IMPLANT
GOWN STRL REUS W/TWL LRG LVL3 (GOWN DISPOSABLE) ×2
GOWN STRL REUS W/TWL XL LVL3 (GOWN DISPOSABLE) ×4
K-WIRE DBL END TROCAR 6X.045 (WIRE)
K-WIRE DBL END TROCAR 6X.062 (WIRE)
KIT ROOM TURNOVER OR (KITS) ×5 IMPLANT
KWIRE DBL END TROCAR 6X.045 (WIRE) IMPLANT
KWIRE DBL END TROCAR 6X.062 (WIRE) IMPLANT
NEEDLE HYPO 18GX1.5 BLUNT FILL (NEEDLE) ×5 IMPLANT
NEEDLE HYPO 25GX1X1/2 BEV (NEEDLE) IMPLANT
NS IRRIG 500ML POUR BTL (IV SOLUTION) ×5 IMPLANT
PACK EXTREMITY ARMC (MISCELLANEOUS) ×5 IMPLANT
PAD CAST CTTN 3X4 STRL (SOFTGOODS) IMPLANT
PAD GROUND ADULT SPLIT (MISCELLANEOUS) ×5 IMPLANT
RASP SM TEAR CROSS CUT (RASP) IMPLANT
SCREW MINI MONSTER HEADLES 12M (Screw) ×10 IMPLANT
SCREW MINI MONSTER HEADLES 13M (Screw) ×8 IMPLANT
SPLINT CAST 1 STEP 4X30 (MISCELLANEOUS) ×5 IMPLANT
SPLINT FAST PLASTER 5X30 (CAST SUPPLIES)
SPLINT PLASTER CAST FAST 5X30 (CAST SUPPLIES) IMPLANT
SPONGE GAUZE 4X4 16PLY NS LF (MISCELLANEOUS) ×5 IMPLANT
STOCKINETTE STRL 6IN 960660 (GAUZE/BANDAGES/DRESSINGS) ×10 IMPLANT
STRAP ANKLE DISTRACTOR (MISCELLANEOUS) ×5 IMPLANT
STRAP BODY AND KNEE 60X3 (MISCELLANEOUS) ×5 IMPLANT
STRIP CLOSURE SKIN 1/4X4 (GAUZE/BANDAGES/DRESSINGS) ×4 IMPLANT
SUT ETHILON 4-0 (SUTURE)
SUT ETHILON 4-0 FS2 18XMFL BLK (SUTURE)
SUT ETHILON 5-0 FS-2 18 BLK (SUTURE) IMPLANT
SUT VIC AB 1 CT1 36 (SUTURE) IMPLANT
SUT VIC AB 2-0 CT1 27 (SUTURE)
SUT VIC AB 2-0 CT1 TAPERPNT 27 (SUTURE) IMPLANT
SUT VIC AB 2-0 SH 27 (SUTURE)
SUT VIC AB 2-0 SH 27XBRD (SUTURE) IMPLANT
SUT VIC AB 3-0 SH 27 (SUTURE)
SUT VIC AB 3-0 SH 27X BRD (SUTURE) IMPLANT
SUT VIC AB 4-0 FS2 27 (SUTURE) ×5 IMPLANT
SUT VICRYL AB 3-0 FS1 BRD 27IN (SUTURE) IMPLANT
SUTURE ETHLN 4-0 FS2 18XMF BLK (SUTURE) IMPLANT
SYRINGE 10CC LL (SYRINGE) ×5 IMPLANT
TOWEL OR 17X26 4PK STRL BLUE (TOWEL DISPOSABLE) ×10 IMPLANT

## 2016-06-16 NOTE — Anesthesia Postprocedure Evaluation (Signed)
Anesthesia Post Note  Patient: Ashley Novak  Procedure(s) Performed: Procedure(s) (LRB): GASTROC RECESSION EXTREMITY LEFT (Left) WEIL OSTEOTOMY LEFT 2ND AND 3RD METATARSAL (Left)  Patient location during evaluation: PACU Anesthesia Type: General and Regional Level of consciousness: awake Pain management: pain level controlled Vital Signs Assessment: post-procedure vital signs reviewed and stable Respiratory status: spontaneous breathing Cardiovascular status: blood pressure returned to baseline Postop Assessment: no headache Anesthetic complications: no    Verner Cholunkle, III,  Shanitra Phillippi D

## 2016-06-16 NOTE — Addendum Note (Signed)
Addendum  created 06/16/16 1705 by Jolayne Pantherichard Patrick Salemi, MD   Anesthesia Intra Blocks edited, Child order released for a procedure order, Sign clinical note

## 2016-06-16 NOTE — Anesthesia Procedure Notes (Signed)
Anesthesia Regional Block:  Popliteal block  Pre-Anesthetic Checklist: ,, timeout performed, Correct Patient, Correct Site, Correct Laterality, Correct Procedure, Correct Position, site marked, Risks and benefits discussed,  Surgical consent,  Pre-op evaluation,  At surgeon's request and post-op pain management  Laterality: Left  Prep: chloraprep       Needles:  Injection technique: Single-shot  Needle Type: Stimulator Needle - 40     Needle Length: 15cm 15 cm Needle Gauge: 21 and 21 G    Additional Needles:  Procedures: ultrasound guided (picture in chart) and nerve stimulator Popliteal block Narrative:  Start time: 06/16/2016 6:55 AM End time: 06/16/2016 7:02 AM Injection made incrementally with aspirations every 5 mL.  Performed by: Personally  Anesthesiologist: Jolayne PantherUNKLE, Rik Wadel  Additional Notes: Total 35 ml of 0.5% Ropivacaine.

## 2016-06-16 NOTE — Anesthesia Procedure Notes (Signed)
Procedure Name: LMA Insertion Performed by: Orlin HildingLEBLANC, Lexee Brashears Pre-anesthesia Checklist: Patient identified, Emergency Drugs available, Suction available, Patient being monitored and Timeout performed Patient Re-evaluated:Patient Re-evaluated prior to inductionOxygen Delivery Method: Circle system utilized Preoxygenation: Pre-oxygenation with 100% oxygen Intubation Type: IV induction Ventilation: Mask ventilation with difficulty LMA: LMA inserted LMA Size: 4.0

## 2016-06-16 NOTE — Progress Notes (Signed)
Assisted Daniel Runkle ANMD with left, ultrasound guided, popliteal block. Side rails up, monitors on throughout procedure. See vital signs in flow sheet. Tolerated Procedure well.   

## 2016-06-16 NOTE — Op Note (Signed)
Operative note   Surgeon: Dr. Recardo EvangelistMatthew Jeric Slagel, DPM.    Assistant: None    Preop diagnosis: 1. Gastroc equinus left 2. Plantar displaced second metatarsal left 3. Plantar displaced third metatarsal left    Postop diagnosis: Same    Procedure:   1. Gastroc recession left lower leg   2. Osteotomy second metatarsal left foot   3. Osteotomy third metatarsal left foot     EBL: Negligible    Anesthesia:general with popliteal block delivered by anesthesia team.    Hemostasis: Thigh tourniquet at 350 mils mercury pressure for 60 minutes    Specimen: None    Complications: None    Operative indications: Chronic pain and discomfort resistant to conservative care    Procedure:  Patient was brought into the OR and placed on the operating table in thesupine position. After anesthesia was obtained theleft lower extremity was prepped and draped in usual sterile fashion.  Operative Report: This time to his directed to the posterior left leg. The patient then prepped and draped in the leg was elevated and stabilized with a ankle harness attention was directed to the posterior gastroc tendon area where a 3 cm linear incision was made and deepened sharp blunt dissection bleeders clamped and bovied as required. Vital structures retracted mediolaterally. The tendon sheath over the gastroc tendon was notified and incised longitudinally the peritenon was identified and incised longitudinally this was reflected mediolaterally. This point a medial to lateral release of the gastrocnemius tendon was achieved significant increase in dorsiflexion with knee extended was achieved at this point approximately 15. This juncture there was copiously irrigated peritenon and tendon sheath were closed with 4 Vicryl in continuous stitch. Deep superficial fascial layers closed with 4 Vicryl in continuous stitch. Skin closed with Vicryl subcuticular fashion. This area was dressed with Steri-Strips gauze and Tegaderm and  attention directed to the dorsum of the left foot at this timeframe.  This timeframe a 4 cm linear incision made in between the second third metatarsal shafts of the MTP joint level. This incision was deepened sharp blunt dissection initially carried to the second metatarsal where the extensor tendon was identified. The extensor tendon was released the tendon was retracted laterally and incision made through the periosteum down to bone. This freed mediolaterally the distal shaft and metatarsal metatarsal head were identified. Osteotomy was then performed in the second metatarsal starting at the osteochondral junction from dorsal distal plantar proximal. Was cut was through and through the metatarsal was shortened by several millimeters and temporally fixated with K wires. The areas and checked FluoroScan good shortening of the metatarsal been achieved. Metatarsal still longer than the first and slightly longer than the fourth. The metatarsal head was also rotated little laterally and fixated with 2.2 mm screws from the [employer] mini monster set. There is checked again for with FluoroScan good position and correction fixation were noted. This point the incision was freed towards the third metatarsal distal shaft and a similar procedure was performed. The metatarsal heads were compared and a good metatarsal parabola was achieved at this point.  After copious irrigation the periosteal Tissues over each metatarsal was closed with or Vicryl in continuous stitch. Deep superficial fascial layers were then closed with 4 Vicryl also and continuous stitch and skin closed with 4 Vicryl subcuticular fashion. There is an block 6 cc 0.5% Marcaine plain. The Achilles area and early been blocked with 10 cc of lidocaine 1.4 thousand epinephrine. Tourniquet was not elevated until the the procedure started.  Sterile compressive dressings were placed across wound consisting of Steri-Strips Xeroform gauze 4 x 4's Kling and Kerlix  tourniquet was released prompt complete vascularity seen to return all digits of the left foot.    Patient tolerated the procedure and anesthesia well.  Was transported from the OR to the PACU with all vital signs stable and vascular status intact. To be discharged per routine protocol.  Will follow up in approximately 1 week in the outpatient clinic.

## 2016-06-16 NOTE — H&P (Signed)
H and P has been reviewed and no changes are noted.  

## 2016-06-16 NOTE — Anesthesia Preprocedure Evaluation (Signed)
Anesthesia Evaluation  Patient identified by MRN, date of birth, ID band Patient awake    Reviewed: Allergy & Precautions, H&P , NPO status , Patient's Chart, lab work & pertinent test results  History of Anesthesia Complications Negative for: history of anesthetic complications  Airway Mallampati: II  TM Distance: >3 FB Neck ROM: full    Dental no notable dental hx.    Pulmonary asthma ,    Pulmonary exam normal        Cardiovascular negative cardio ROS Normal cardiovascular exam     Neuro/Psych    GI/Hepatic Neg liver ROS, hiatal hernia, Medicated,  Endo/Other  negative endocrine ROS  Renal/GU negative Renal ROS     Musculoskeletal   Abdominal   Peds  Hematology negative hematology ROS (+)   Anesthesia Other Findings   Reproductive/Obstetrics                             Anesthesia Physical Anesthesia Plan  ASA: II  Anesthesia Plan: General LMA and Regional   Post-op Pain Management: GA combined w/ Regional for post-op pain   Induction:   Airway Management Planned:   Additional Equipment:   Intra-op Plan:   Post-operative Plan:   Informed Consent: I have reviewed the patients History and Physical, chart, labs and discussed the procedure including the risks, benefits and alternatives for the proposed anesthesia with the patient or authorized representative who has indicated his/her understanding and acceptance.     Plan Discussed with:   Anesthesia Plan Comments:         Anesthesia Quick Evaluation

## 2016-06-16 NOTE — Transfer of Care (Signed)
Immediate Anesthesia Transfer of Care Note  Patient: Lisette GrinderIleanna E Aguas  Procedure(s) Performed: Procedure(s) with comments: GASTROC RECESSION EXTREMITY LEFT (Left) - LMA WITH POPLITEAL WEIL OSTEOTOMY LEFT 2ND AND 3RD METATARSAL (Left)  Patient Location: PACU  Anesthesia Type: General LMA, Regional  Level of Consciousness: awake, alert  and patient cooperative  Airway and Oxygen Therapy: Patient Spontanous Breathing and Patient connected to supplemental oxygen  Post-op Assessment: Post-op Vital signs reviewed, Patient's Cardiovascular Status Stable, Respiratory Function Stable, Patent Airway and No signs of Nausea or vomiting  Post-op Vital Signs: Reviewed and stable  Complications: No apparent anesthesia complications

## 2016-06-17 ENCOUNTER — Encounter: Payer: Self-pay | Admitting: Podiatry

## 2016-10-10 ENCOUNTER — Other Ambulatory Visit: Payer: Self-pay | Admitting: Internal Medicine

## 2016-10-10 DIAGNOSIS — Z1231 Encounter for screening mammogram for malignant neoplasm of breast: Secondary | ICD-10-CM

## 2016-11-03 ENCOUNTER — Emergency Department: Payer: BC Managed Care – PPO

## 2016-11-03 ENCOUNTER — Emergency Department
Admission: EM | Admit: 2016-11-03 | Discharge: 2016-11-03 | Disposition: A | Discharge status: Home / Self Care | Payer: BC Managed Care – PPO | Attending: Emergency Medicine | Admitting: Emergency Medicine

## 2016-11-03 DIAGNOSIS — M79604 Pain in right leg: Secondary | ICD-10-CM | POA: Diagnosis present

## 2016-11-03 DIAGNOSIS — J45909 Unspecified asthma, uncomplicated: Secondary | ICD-10-CM | POA: Diagnosis not present

## 2016-11-03 DIAGNOSIS — Z79899 Other long term (current) drug therapy: Secondary | ICD-10-CM | POA: Diagnosis not present

## 2016-11-03 DIAGNOSIS — L03115 Cellulitis of right lower limb: Secondary | ICD-10-CM | POA: Insufficient documentation

## 2016-11-03 MED ORDER — CEPHALEXIN 500 MG PO CAPS: 500.0000 mg | ORAL_CAPSULE | Freq: Three times a day (TID) | ORAL | 0 refills | Status: AC

## 2016-11-03 MED ORDER — CEPHALEXIN 500 MG PO CAPS
500.0000 mg | ORAL_CAPSULE | Freq: Once | ORAL | Status: AC
  Administered 2016-11-03: 500 mg via ORAL

## 2016-11-03 MED ORDER — CEPHALEXIN 500 MG PO CAPS
ORAL_CAPSULE | ORAL | Status: AC
  Filled 2016-11-03: qty 1

## 2016-11-03 NOTE — ED Triage Notes (Signed)
Pt in with co right lower leg pain states worse to posterior ankle area, denies any injury. States also noted some warmth, was sent here for urgent care for DVT vs cellulitis.

## 2016-11-03 NOTE — ED Provider Notes (Signed)
[facility] Emergency Department Provider Note  Time seen: 7:28 PM  I have reviewed the triage vital signs and the nursing notes.   HISTORY  Chief Complaint Leg Pain    HPI Ashley Novak is a 56 y.o. female with a past medical history of anxiety, asthma, gastric reflux, presents to the emergency department for right leg discomfort and swelling. According to the patient since yesterday morning she has noticed a mild discomfort to her distal right lower extremity especially in the lower calf. Today she noted a mild redness and swelling to the area as well. 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 Patient denies any history of blood clot in the past. Denies any estrogen use. Denies any recent trips or surgeries. She did have a right foot surgery but this was nearly a year ago. Denies any chest pain or trouble breathing. Large negative review of systems. Describes right lower extremity pain as a dull aching sensation mild in severity.  Past Medical History:  Diagnosis Date  . Anxiety   . Asthma   . Bilateral thumb pain   . Bulging lumbar disc    C5 AND C6  . Colon polyps   . Cystocele   . Depression   . GERD (gastroesophageal reflux disease)    reflux esophagitis  . H/O motion sickness    AND VERTIGO WITH ALLERGIES  . Hiatal hernia   . High cholesterol   . IBS (irritable bowel syndrome)   . Increased BMI   . Insomnia   . Menopause   . Mixed incontinence    stress and urgency  . Osteoarthritis    chronic  . RAD (reactive airway disease)     There are no active problems to display for this patient.   Past Surgical History:  Procedure Laterality Date  . DIAGNOSTIC LAPAROSCOPY     with evidence of endometriosis  . DILATION AND CURETTAGE OF UTERUS    . FOOT SURGERY    . GASTROC RECESSION EXTREMITY Right 12/03/2015   Procedure: GASTROC  RECESSION RIGHT ACHILLES,OSTEOTOMY 2ND METATARSAL WITH 2 SCREW FIXATION,OSTEOTOMY 3RD METATARSAL WITH 2 SCREW FIXATION,HAMMERTOE REPAIR WITH ARTHRODESIS OF THE PIP JOINT AND K WIRE FIXATION;  Surgeon: Recardo Evangelist, DPM;  Location: [facility];  Service: Podiatry;  Laterality: Right;  AND LOWER LEG  . GASTROC RECESSION EXTREMITY Left 06/16/2016   Procedure: GASTROC RECESSION EXTREMITY LEFT;  Surgeon: Recardo Evangelist, DPM;  Location: [facility];  Service: Podiatry;  Laterality: Left;  LMA WITH POPLITEAL  . HYSTEROSCOPY    . WEIL OSTEOTOMY Left 06/16/2016   Procedure: WEIL OSTEOTOMY LEFT 2ND AND 3RD METATARSAL;  Surgeon: Recardo Evangelist, DPM;  Location: [facility];  Service: Podiatry;  Laterality: Left;    Prior to Admission medications   Medication Sig Start Date End Date Taking? Authorizing Provider  albuterol (PROVENTIL HFA;VENTOLIN HFA) 108 (90 BASE) MCG/ACT inhaler Inhale 2 puffs into the lungs every 6 (six) hours as needed for wheezing or shortness of breath.     [provider]  Biotin 60454 MCG TBDP Take by mouth daily.    [provider]  calcium carbonate (TUMS - DOSED IN MG ELEMENTAL CALCIUM) 500 MG chewable tablet Chew 2 tablets by mouth daily. Reported on 12/03/2015    [provider]  celecoxib (CELEBREX) 200 MG capsule Take 200 mg by mouth 2 (two)  times daily.    [provider]  Cholecalciferol (VITAMIN D-3) 1000 units CAPS Take by mouth. Reported on 11/25/2015    [provider]  conjugated estrogens (PREMARIN) vaginal cream Place 1 Applicatorful vaginally daily. Reported on 11/25/2015    [provider]  esomeprazole (NEXIUM) 20 MG capsule Take 20 mg by mouth daily at 12 noon.    [provider]  fluticasone-salmeterol (ADVAIR HFA) 115-21 MCG/ACT inhaler Inhale 2 puffs into the lungs 2 (two) times daily.    [provider]  Magnesium Oxide 250 MG TABS Take by mouth.    [provider]  Melatonin 10 MG TABS Take by mouth at bedtime.    [provider]  montelukast (SINGULAIR) 10 MG tablet Take 10 mg by mouth at bedtime.    [provider]  omeprazole (PRILOSEC) 20 MG capsule Take 20 mg by mouth daily. PM    [provider]  oxyCODONE-acetaminophen (PERCOCET) 7.5-325 MG tablet Take 1 tablet by mouth every 4 (four) hours as needed for severe pain. 06/16/16   Troxler, Molli HazardMatthew, DPM  temazepam (RESTORIL) 15 MG capsule Take 15 mg by mouth at bedtime as needed for sleep.    [provider]  tiZANidine (ZANAFLEX) 4 MG tablet Take 4 mg by mouth 2 (two) times daily as needed for muscle spasms.    [provider]  traMADol (ULTRAM) 50 MG tablet Take by mouth every 6 (six) hours as needed.    [provider]    Allergies  Allergen Reactions  . Diclofenac Hives  . Nsaids Hives  . Percocet [Oxycodone-Acetaminophen] Itching  . Sulfites Other (See Comments)    FEELS LIKE PASSING OUT, STABBING PAIN IN BACK  . Penicillins Rash    Family History  Problem Relation Age of Onset  . Diabetes Father   . Lymphoma Brother        non hodgkins    Social History Social History  Substance Use Topics  . Smoking status: Never Smoker  . Smokeless tobacco: Never Used  . Alcohol use Yes     Comment: occas    Review of Systems Constitutional: Negative for fever. Cardiovascular: Negative for chest pain. Respiratory: Negative for shortness of breath. Gastrointestinal: Negative for abdominal pain Musculoskeletal: Mild dull aching right leg pain Neurological: Negative for headaches, focal weakness or numbness. All other ROS negative  ____________________________________________   PHYSICAL EXAM:  VITAL SIGNS: ED Triage Vitals  Enc Vitals Group     BP 11/03/16 1912 (!) 155/86     Pulse Rate 11/03/16 1908 96     Resp 11/03/16 1908 20     Temp 11/03/16 1908 98.6 F (37 C)     Temp Source 11/03/16 1908 Oral     SpO2  11/03/16 1908 99 %     Weight 11/03/16 1909 290 lb (131.5 kg)     Height 11/03/16 1909 5\' 8"  (1.727 m)     Head Circumference --      Peak Flow --      Pain Score 11/03/16 1908 3     Pain Loc --      Pain Edu? --      Excl. in GC? --     Constitutional: Alert and oriented. Well appearing and in no distress. Eyes: Normal exam ENT   Head: Normocephalic and atraumatic   Mouth/Throat: Mucous membranes are moist. Cardiovascular: Normal rate, regular rhythm. No murmur Respiratory: Normal respiratory effort without tachypnea nor retractions. Breath sounds are clear Gastrointestinal:  Soft and nontender. No distention.  Musculoskeletal: Mild edema of the right lower extremity with mild erythema of the distal calf with mild tenderness to this area. Sensation intact and equal. 2+ DP pulse. Neurologic:  Normal speech and language. No gross focal neurologic deficits are appreciated. Skin:  Skin is warm, dry and intact.  Psychiatric: Mood and affect are normal.  ____________________________________________     RADIOLOGY  Ultrasound negative  ____________________________________________   INITIAL IMPRESSION / ASSESSMENT AND PLAN / ED COURSE  Pertinent labs & imaging results that were available during my care of the patient were reviewed by me and considered in my medical decision making (see chart for details).  Patient presents to the emergency department for right lower extremity redness swelling and discomfort which started yesterday morning. Overall the patient appears well on exam, has mild right lower extremity swelling with erythema of the distal calf with mild tenderness palpation. I agree the exam is most consistent with either DVT or cellulitis. We will obtain an ultrasound, if ultrasound is negative for DVT we will treat with antibiotics and have the patient follow-up with her doctor. If the ultrasound is positive we'll obtain lab work and start the patient on  anticoagulation. Patient denies any chest pain or trouble breathing. Vitals are largely normal. No concern for PE.  Ultrasound is negative for DVT. We will cover with Keflex for likely cellulitis. Patient will follow up with a primary care doctor.  ____________________________________________   FINAL CLINICAL IMPRESSION(S) / ED DIAGNOSES  Right lower extremity pain Right lower extremity cellulitis   Minna Antis, MD 11/03/16 2137

## 2017-04-26 IMAGING — CR DG FOOT COMPLETE 3+V*R*
1 series · 2 of 2 positions shown · non-contrast
Comparison: None.

CLINICAL DATA: Infection with prior fracture second metatarsal

EXAM:
RIGHT FOOT COMPLETE - 2 VIEW

[Series 1: ap · 0.17mm/px · 2 of 2 slices shown]
[im 1/2]
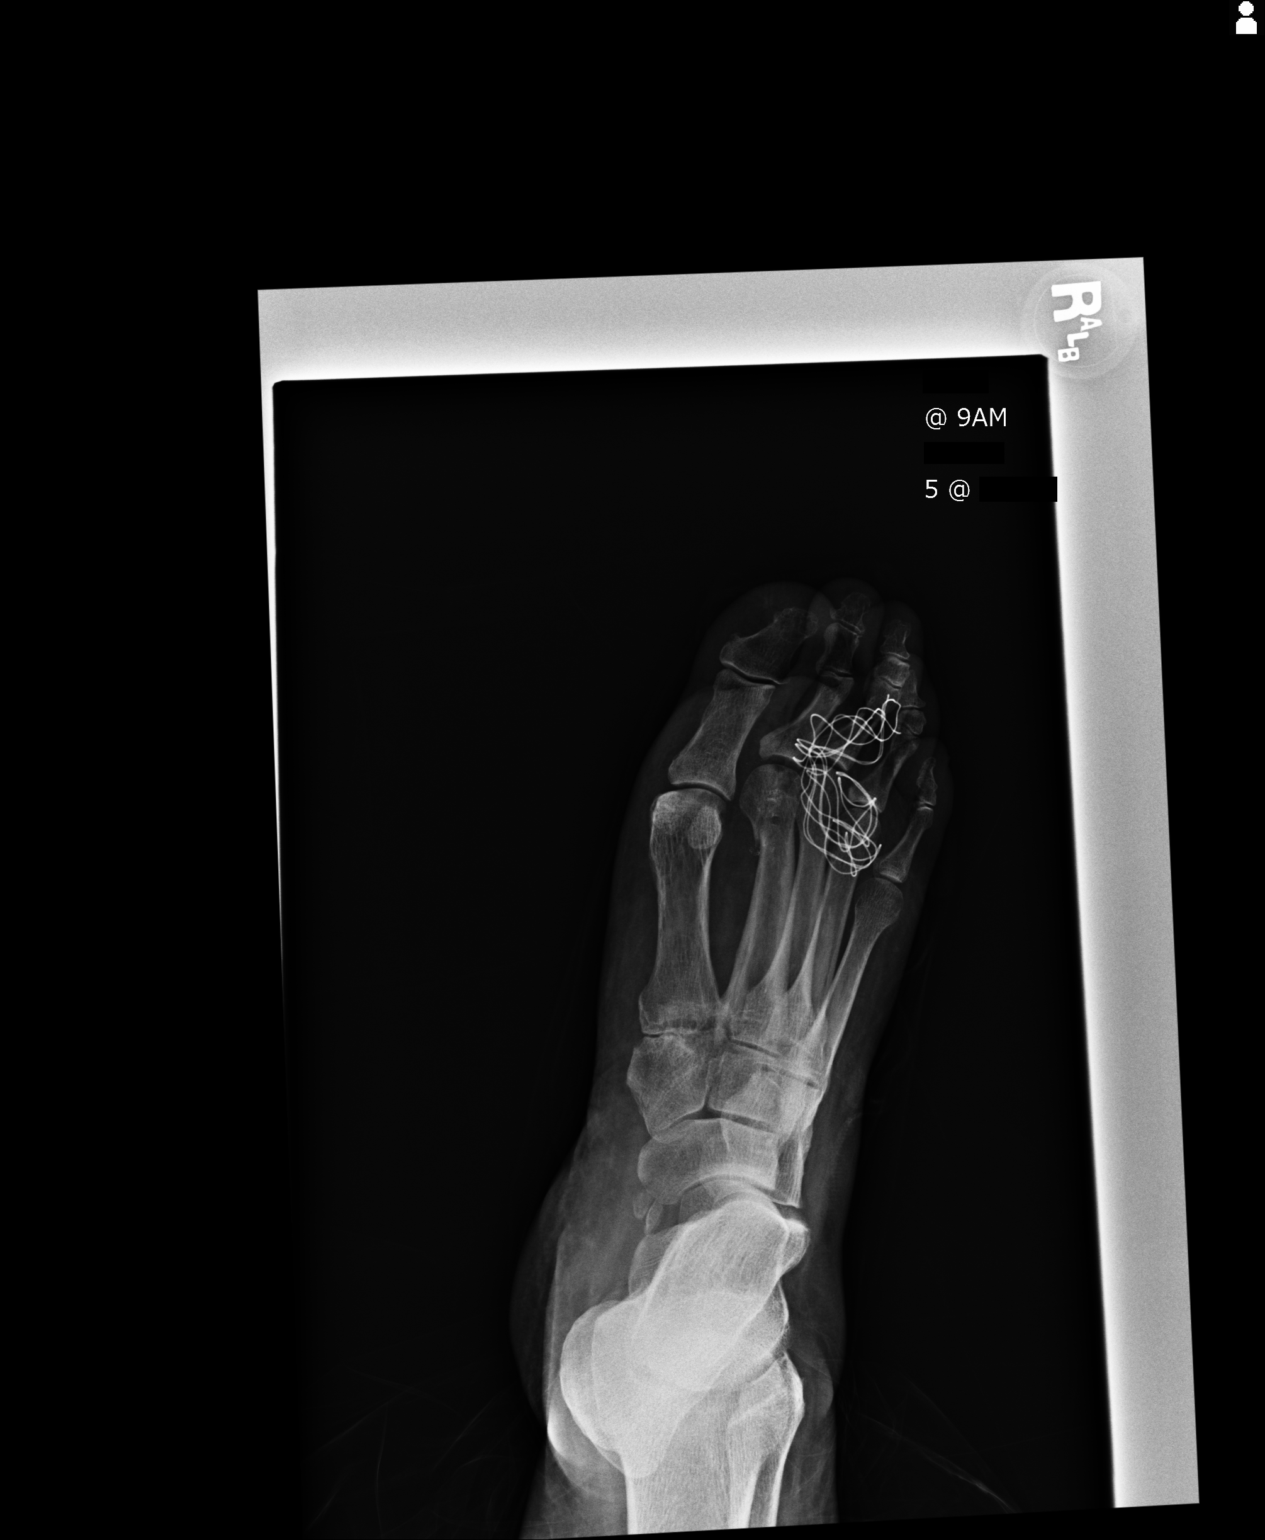
[im 2/2]
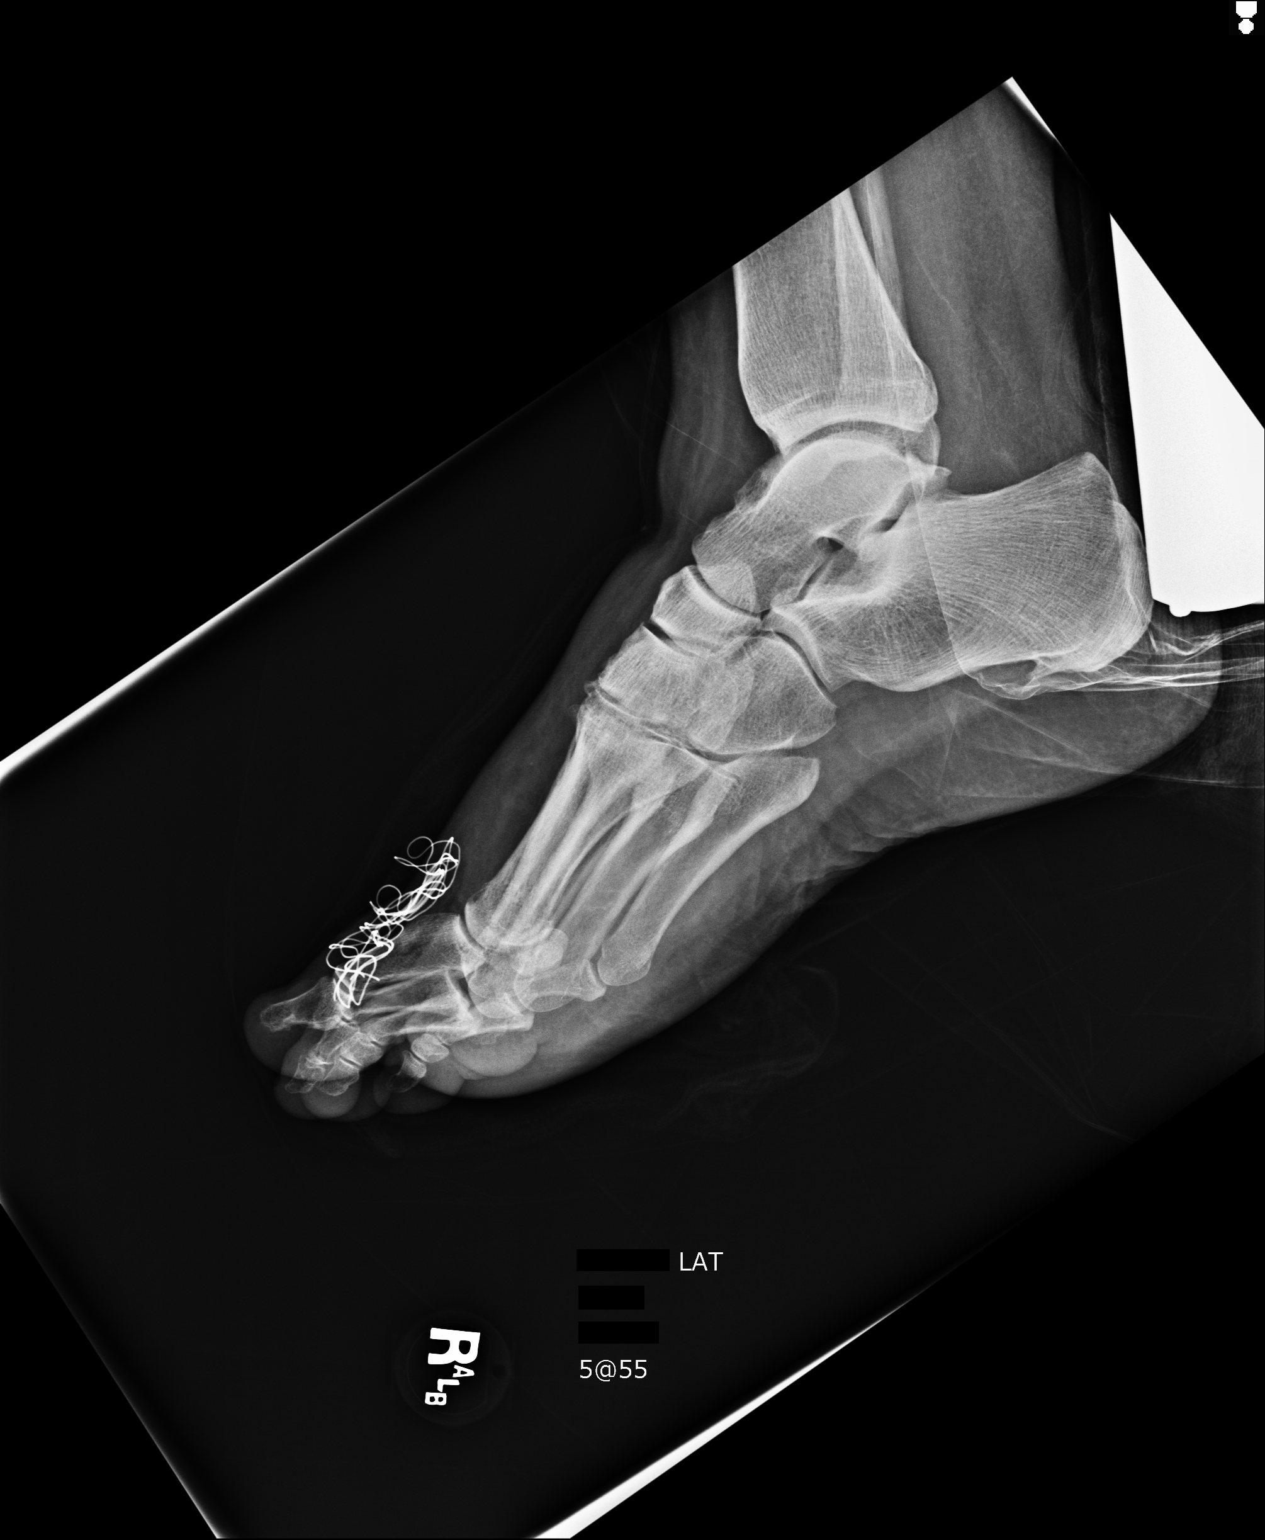

[2 of 2 positions shown; findings below may reference images not displayed]

FINDINGS: Frontal and lateral views obtained. There is an apparent sponge
overlying the dorsum of the forefoot. No other radiopaque foreign
body seen. There is a healing fracture along the medial distal
second metatarsal in near anatomic alignment. No other evidence of
fracture. No dislocation. There is spur formation in the dorsal
midfoot. No erosive change or bony destruction.
IMPRESSION: No radiopaque foreign body except for a sponge overlying the dorsum
of the forefoot. Healing fracture medial second metatarsal distally.
No other fracture. No erosive change or bony destruction. Spurring
dorsal midfoot.

Critical Value/emergent results were called by telephone at the time
of interpretation on 12/03/2015 at [DATE] to Dr. JEVAN TIGER ,
who verbally acknowledged these results.

## 2017-06-26 ENCOUNTER — Ambulatory Visit
Admission: RE | Admit: 2017-06-26 | Discharge: 2017-06-26 | Disposition: A | Discharge status: Home / Self Care | Payer: BC Managed Care – PPO | Source: Ambulatory Visit | Attending: Internal Medicine | Admitting: Internal Medicine

## 2017-06-26 DIAGNOSIS — Z1231 Encounter for screening mammogram for malignant neoplasm of breast: Secondary | ICD-10-CM

## 2017-06-30 ENCOUNTER — Other Ambulatory Visit: Payer: Self-pay | Admitting: Internal Medicine

## 2017-06-30 DIAGNOSIS — R928 Other abnormal and inconclusive findings on diagnostic imaging of breast: Secondary | ICD-10-CM

## 2017-06-30 DIAGNOSIS — N6489 Other specified disorders of breast: Secondary | ICD-10-CM

## 2017-12-26 IMAGING — US US EXTREM LOW VENOUS*R*
1 series · 13 of 24 positions shown · non-contrast
Comparison: None.

CLINICAL DATA: Right lower leg pain and swelling.



[Series 1: us extrem low venous*right* · 0.09mm/px · 13 of 33 slices shown]
[im 1/33]
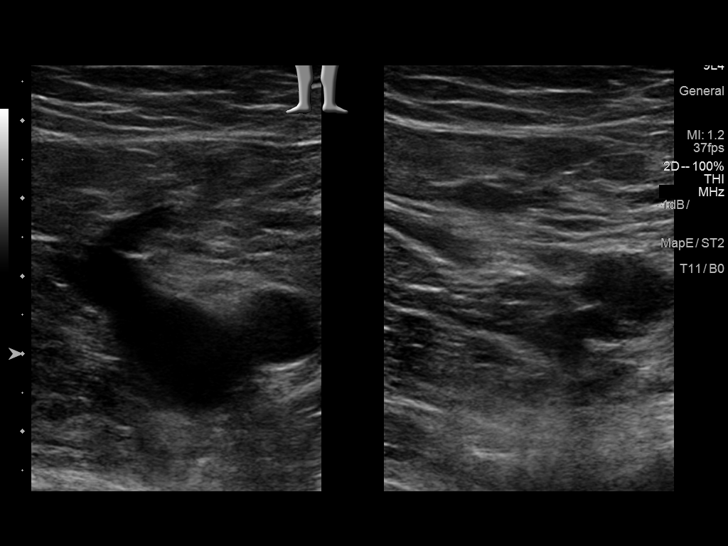
[im 3/33]
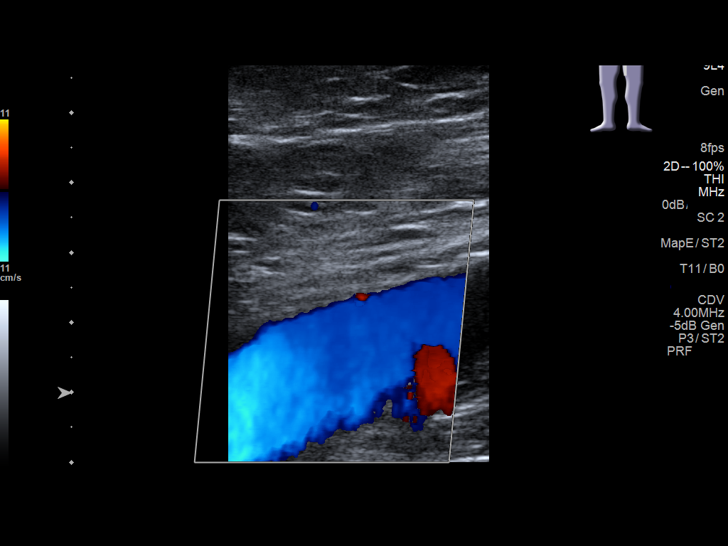
[im 6/33]
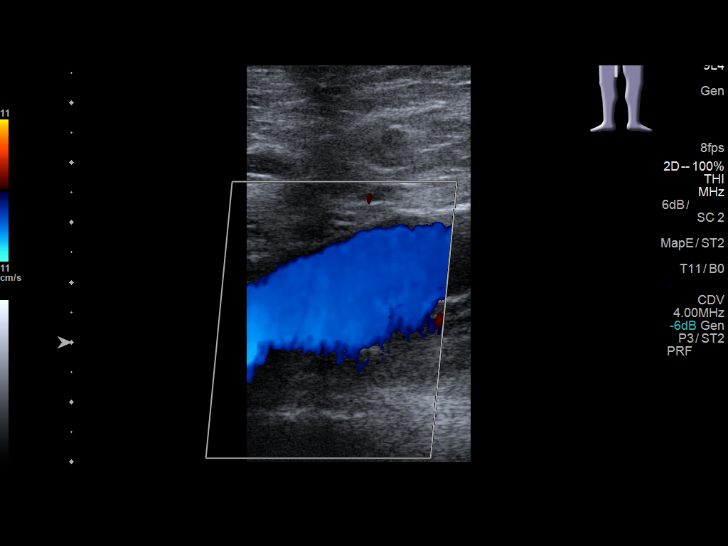
[im 9/33]
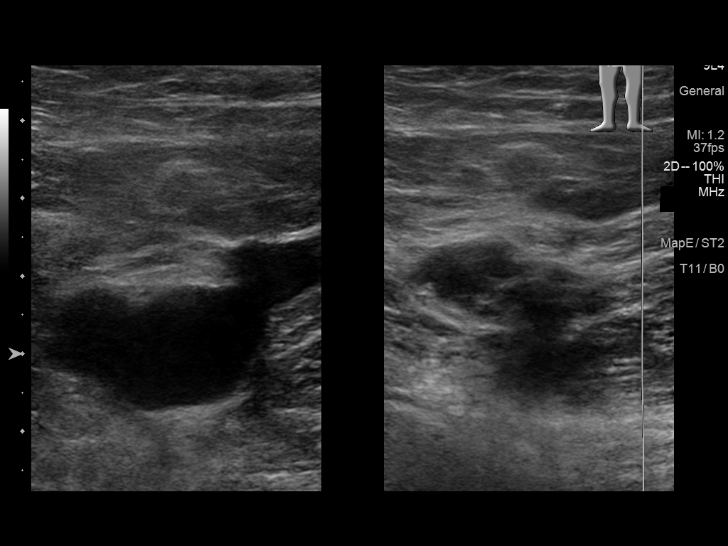
[im 12/33]
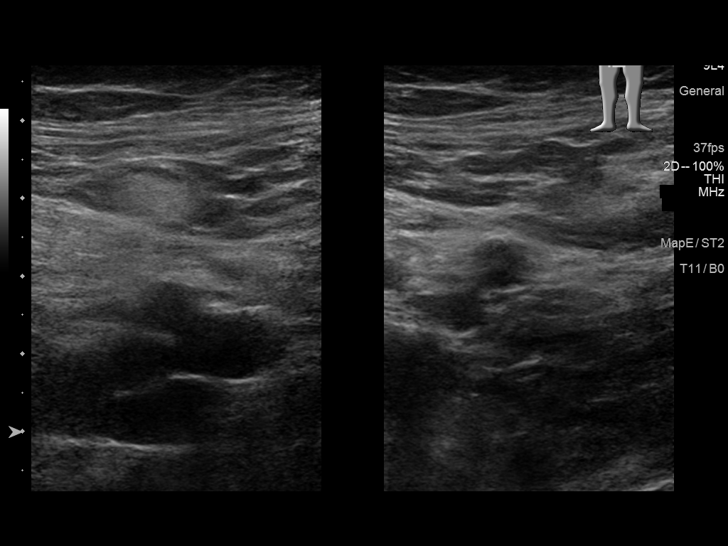
[im 14/33]
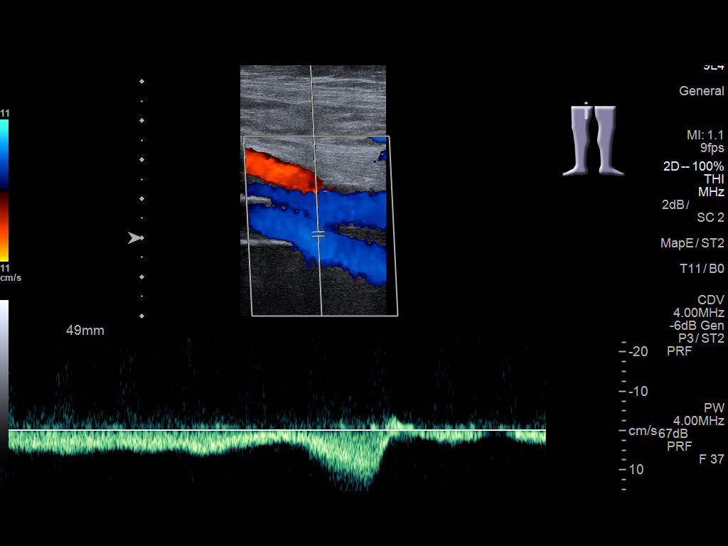
[im 17/33]
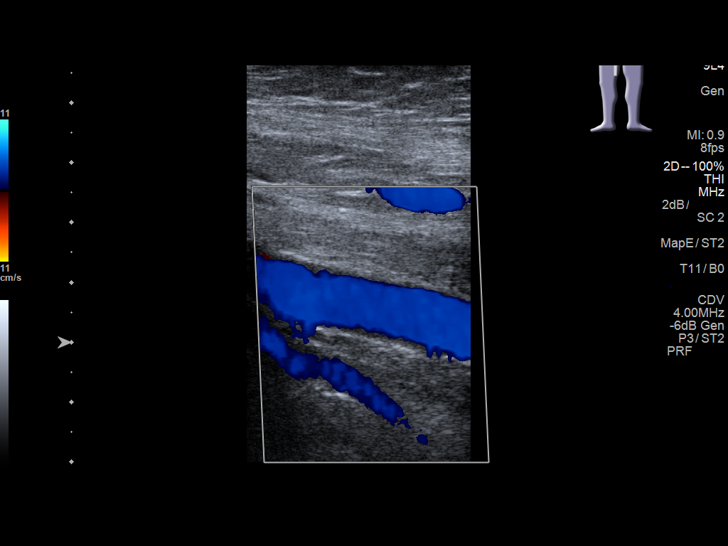
[im 19/33]
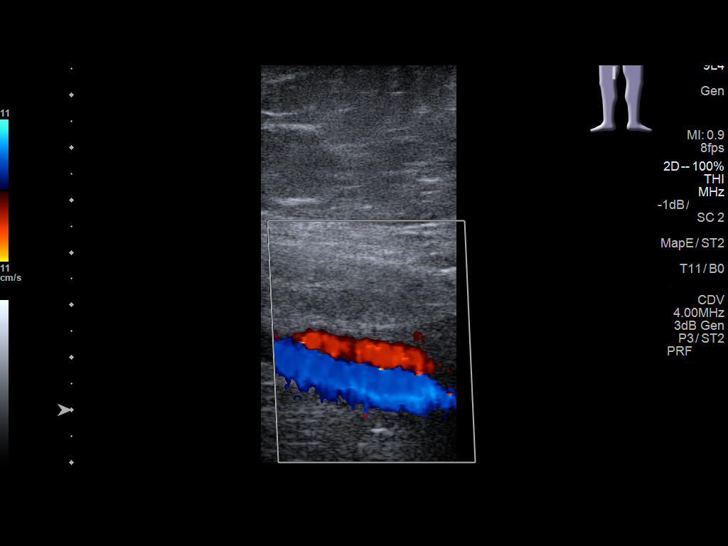
[im 21/33]
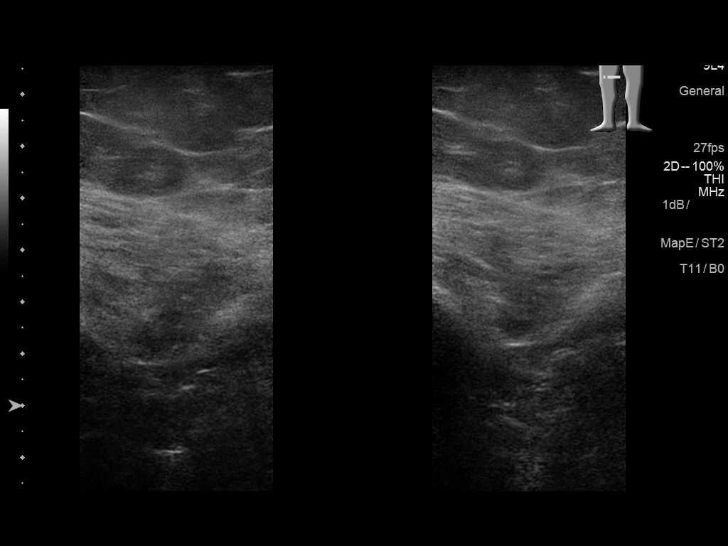
[im 24/33]
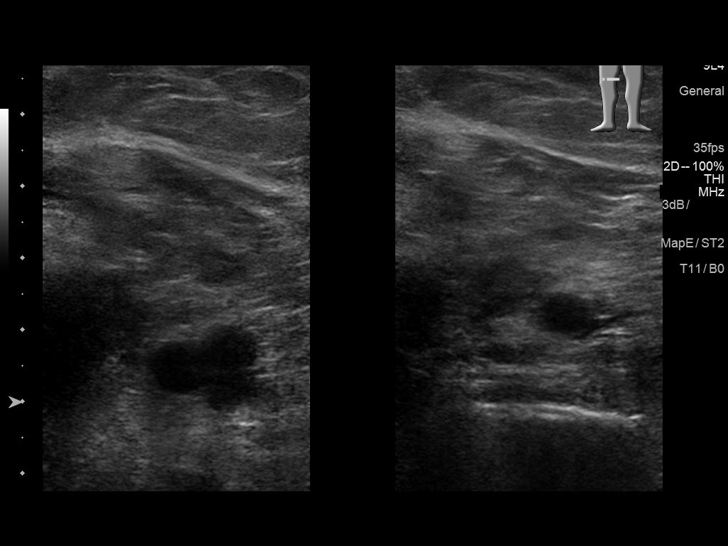
[im 27/33]
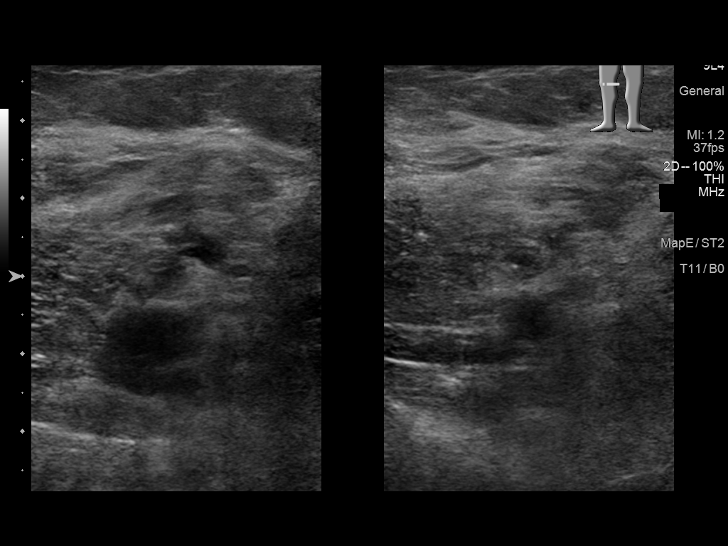
[im 30/33]
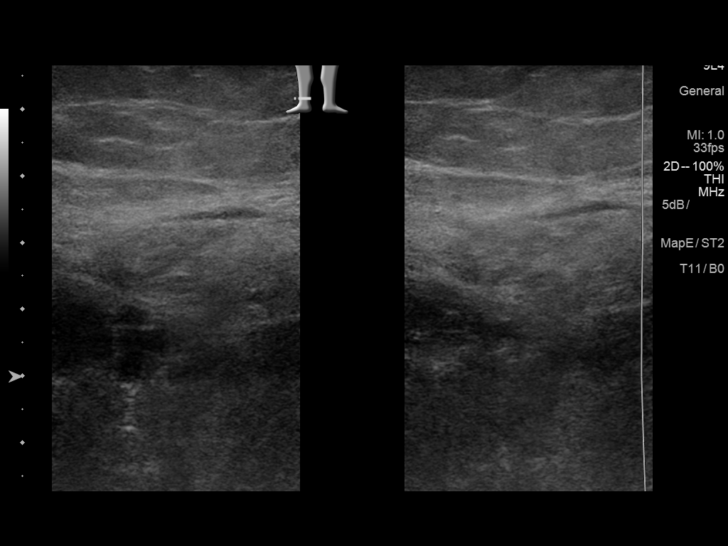
[im 33/33]
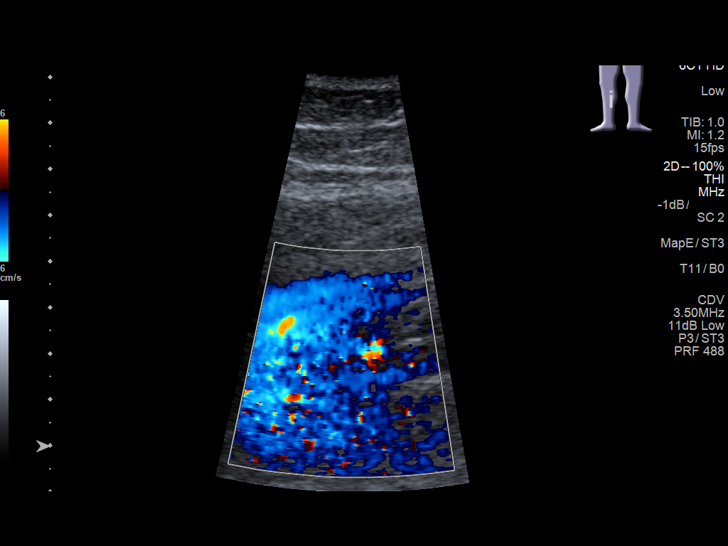

[13 of 24 positions shown; findings below may reference images not displayed]

FINDINGS: Contralateral Common Femoral Vein: Respiratory phasicity is normal
and symmetric with the symptomatic side. No evidence of thrombus.
Normal compressibility.

Common Femoral Vein: No evidence of thrombus. Normal
compressibility, respiratory phasicity and response to augmentation.

Saphenofemoral Junction: No evidence of thrombus. Normal
compressibility and flow on color Doppler imaging.

Profunda Femoral Vein: No evidence of thrombus. Normal
compressibility and flow on color Doppler imaging.

Femoral Vein: No evidence of thrombus. Normal compressibility,
respiratory phasicity and response to augmentation.

Popliteal Vein: No evidence of thrombus. Normal compressibility,
respiratory phasicity and response to augmentation.

Calf Veins: No evidence of thrombus. Normal compressibility and flow
on color Doppler imaging.

Superficial Great Saphenous Vein: No evidence of thrombus. Normal
compressibility and flow on color Doppler imaging.

Venous Reflux:  None.

Other Findings:  None.
IMPRESSION: No evidence of DVT within the right lower extremity.
